# Patient Record
Sex: Male | Born: 2000 | Race: White | Hispanic: No | Marital: Single | Smoking: Never smoker
Health system: Southern US, Community
[De-identification: ages and names within clinical notes are randomized; demographics above are authoritative.]

---
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
## 2023-03-18 ENCOUNTER — Emergency Department (HOSPITAL_COMMUNITY)
Admission: EM | Admit: 2023-03-18 | Discharge: 2023-03-22 | Disposition: A | Discharge status: Home / Self Care | Payer: Worker's Compensation | Attending: Emergency Medicine | Admitting: Emergency Medicine

## 2023-03-18 ENCOUNTER — Encounter (HOSPITAL_COMMUNITY)
Admission: EM | Disposition: A | Discharge status: Home / Self Care | Payer: Self-pay | Source: Home / Self Care | Attending: Emergency Medicine

## 2023-03-18 ENCOUNTER — Emergency Department (HOSPITAL_COMMUNITY): Payer: Worker's Compensation | Admitting: Certified Registered Nurse Anesthetist

## 2023-03-18 ENCOUNTER — Other Ambulatory Visit: Payer: Self-pay

## 2023-03-18 ENCOUNTER — Emergency Department (HOSPITAL_COMMUNITY): Payer: Worker's Compensation

## 2023-03-18 ENCOUNTER — Emergency Department (EMERGENCY_DEPARTMENT_HOSPITAL): Payer: Worker's Compensation | Admitting: Certified Registered Nurse Anesthetist

## 2023-03-18 ENCOUNTER — Encounter (HOSPITAL_COMMUNITY): Payer: Self-pay

## 2023-03-18 DIAGNOSIS — Y990 Civilian activity done for income or pay: Secondary | ICD-10-CM | POA: Diagnosis not present

## 2023-03-18 DIAGNOSIS — S55002A Unspecified injury of ulnar artery at forearm level, left arm, initial encounter: Secondary | ICD-10-CM | POA: Diagnosis not present

## 2023-03-18 DIAGNOSIS — Z91010 Allergy to peanuts: Secondary | ICD-10-CM | POA: Insufficient documentation

## 2023-03-18 DIAGNOSIS — S59912A Unspecified injury of left forearm, initial encounter: Secondary | ICD-10-CM | POA: Diagnosis present

## 2023-03-18 DIAGNOSIS — S55012A Laceration of ulnar artery at forearm level, left arm, initial encounter: Secondary | ICD-10-CM | POA: Diagnosis not present

## 2023-03-18 DIAGNOSIS — Z23 Encounter for immunization: Secondary | ICD-10-CM | POA: Diagnosis not present

## 2023-03-18 DIAGNOSIS — S51812A Laceration without foreign body of left forearm, initial encounter: Secondary | ICD-10-CM | POA: Diagnosis not present

## 2023-03-18 DIAGNOSIS — W230XXA Caught, crushed, jammed, or pinched between moving objects, initial encounter: Secondary | ICD-10-CM | POA: Insufficient documentation

## 2023-03-18 HISTORY — PX: WOUND EXPLORATION: SHX6188

## 2023-03-18 HISTORY — PX: CYSTOSCOPY WITH HOLMIUM LASER LITHOTRIPSY: SHX6639

## 2023-03-18 LAB — COMPREHENSIVE METABOLIC PANEL
ALT: 13 U/L (ref 0–44)
AST: 18 U/L (ref 15–41)
Albumin: 4.1 g/dL (ref 3.5–5.0)
Alkaline Phosphatase: 29 U/L — ABNORMAL LOW (ref 38–126)
Anion gap: 16 — ABNORMAL HIGH (ref 5–15)
BUN: 14 mg/dL (ref 6–20)
CO2: 19 mmol/L — ABNORMAL LOW (ref 22–32)
Calcium: 9.1 mg/dL (ref 8.9–10.3)
Chloride: 104 mmol/L (ref 98–111)
Creatinine, Ser: 1.17 mg/dL (ref 0.61–1.24)
GFR, Estimated: >60 mL/min (ref >60)
Glucose, Bld: 233 mg/dL — ABNORMAL HIGH (ref 70–99)
Potassium: 3.1 mmol/L — ABNORMAL LOW (ref 3.5–5.1)
Sodium: 139 mmol/L (ref 135–145)
Total Bilirubin: 0.8 mg/dL (ref 0.3–1.2)
Total Protein: 6.4 g/dL — ABNORMAL LOW (ref 6.5–8.1)

## 2023-03-18 LAB — I-STAT CHEM 8, ED
BUN: 14 mg/dL (ref 6–20)
BUN: 12 mg/dL (ref 6–20)
Calcium, Ion: 0.94 mmol/L — ABNORMAL LOW (ref 1.15–1.40)
Calcium, Ion: 1.18 mmol/L (ref 1.15–1.40)
Chloride: 106 mmol/L (ref 98–111)
Chloride: 104 mmol/L (ref 98–111)
Creatinine, Ser: 1 mg/dL (ref 0.61–1.24)
Creatinine, Ser: 0.8 mg/dL (ref 0.61–1.24)
Glucose, Bld: 231 mg/dL — ABNORMAL HIGH (ref 70–99)
Glucose, Bld: 145 mg/dL — ABNORMAL HIGH (ref 70–99)
HCT: 40 % (ref 39.0–52.0)
HCT: 31 % — ABNORMAL LOW (ref 39.0–52.0)
Hemoglobin: 13.6 g/dL (ref 13.0–17.0)
Hemoglobin: 10.5 g/dL — ABNORMAL LOW (ref 13.0–17.0)
Potassium: 3.1 mmol/L — ABNORMAL LOW (ref 3.5–5.1)
Potassium: 3.5 mmol/L (ref 3.5–5.1)
Sodium: 139 mmol/L (ref 135–145)
Sodium: 138 mmol/L (ref 135–145)
TCO2: 18 mmol/L — ABNORMAL LOW (ref 22–32)
TCO2: 25 mmol/L (ref 22–32)

## 2023-03-18 LAB — CBC
HCT: 39.4 % (ref 39.0–52.0)
Hemoglobin: 14.4 g/dL (ref 13.0–17.0)
MCH: 34.6 pg — ABNORMAL HIGH (ref 26.0–34.0)
MCHC: 36.5 g/dL — ABNORMAL HIGH (ref 30.0–36.0)
MCV: 94.7 fL (ref 80.0–100.0)
Platelets: 137 10*3/uL — ABNORMAL LOW (ref 150–400)
RBC: 4.16 MIL/uL — ABNORMAL LOW (ref 4.22–5.81)
RDW: 11.6 % (ref 11.5–15.5)
WBC: 11.4 10*3/uL — ABNORMAL HIGH (ref 4.0–10.5)
nRBC: 0 % (ref 0.0–0.2)

## 2023-03-18 LAB — SAMPLE TO BLOOD BANK

## 2023-03-18 LAB — PROTIME-INR
INR: 1.1 (ref 0.8–1.2)
Prothrombin Time: 14.7 seconds (ref 11.4–15.2)

## 2023-03-18 LAB — ABO/RH: ABO/RH(D): O POS

## 2023-03-18 LAB — TYPE AND SCREEN
ABO/RH(D): O POS
Antibody Screen: NEGATIVE

## 2023-03-18 LAB — LACTIC ACID, PLASMA: Lactic Acid, Venous: 2.3 mmol/L (ref 0.5–1.9)

## 2023-03-18 LAB — ETHANOL: Alcohol, Ethyl (B): <10 mg/dL (ref <10)

## 2023-03-18 SURGERY — WOUND EXPLORATION
Anesthesia: General | Site: Arm Lower | Laterality: Left

## 2023-03-18 MED ORDER — PROPOFOL 10 MG/ML IV BOLUS
INTRAVENOUS | Status: DC | PRN
  Administered 2023-03-18: 200 mg via INTRAVENOUS

## 2023-03-18 MED ORDER — MIDAZOLAM HCL 2 MG/2ML IJ SOLN
INTRAMUSCULAR | Status: DC | PRN
  Administered 2023-03-18 (×2): 2 mg via INTRAVENOUS

## 2023-03-18 MED ORDER — LIDOCAINE 2% (20 MG/ML) 5 ML SYRINGE
INTRAMUSCULAR | Status: AC
  Filled 2023-03-18: qty 5

## 2023-03-18 MED ORDER — LIDOCAINE 2% (20 MG/ML) 5 ML SYRINGE
INTRAMUSCULAR | Status: DC | PRN
  Administered 2023-03-18: 60 mg via INTRAVENOUS

## 2023-03-18 MED ORDER — KETAMINE HCL 50 MG/5ML IJ SOSY
PREFILLED_SYRINGE | INTRAMUSCULAR | Status: AC
  Filled 2023-03-18: qty 5

## 2023-03-18 MED ORDER — LACTATED RINGERS IV BOLUS
1000.0000 mL | Freq: Once | INTRAVENOUS | Status: AC
  Administered 2023-03-18: 1000 mL via INTRAVENOUS

## 2023-03-18 MED ORDER — CEFAZOLIN SODIUM-DEXTROSE 2-4 GM/100ML-% IV SOLN
2.0000 g | Freq: Once | INTRAVENOUS | Status: AC
  Administered 2023-03-18: 2 g via INTRAVENOUS

## 2023-03-18 MED ORDER — SODIUM CHLORIDE 0.9 % IR SOLN
Status: DC | PRN
  Administered 2023-03-18: 3000 mL

## 2023-03-18 MED ORDER — SUCCINYLCHOLINE CHLORIDE 200 MG/10ML IV SOSY
PREFILLED_SYRINGE | INTRAVENOUS | Status: DC | PRN
  Administered 2023-03-18: 120 mg via INTRAVENOUS

## 2023-03-18 MED ORDER — PROPOFOL 10 MG/ML IV BOLUS
INTRAVENOUS | Status: AC
  Filled 2023-03-18: qty 20

## 2023-03-18 MED ORDER — IOHEXOL 350 MG/ML SOLN
100.0000 mL | Freq: Once | INTRAVENOUS | Status: AC | PRN
  Administered 2023-03-18: 100 mL via INTRAVENOUS

## 2023-03-18 MED ORDER — HYDROMORPHONE HCL 1 MG/ML IJ SOLN
1.0000 mg | Freq: Once | INTRAMUSCULAR | Status: AC
  Administered 2023-03-18: 1 mg via INTRAVENOUS
  Filled 2023-03-18: qty 1

## 2023-03-18 MED ORDER — FENTANYL CITRATE (PF) 250 MCG/5ML IJ SOLN
INTRAMUSCULAR | Status: DC | PRN
  Administered 2023-03-18 (×3): 50 ug via INTRAVENOUS

## 2023-03-18 MED ORDER — CEFAZOLIN SODIUM-DEXTROSE 2-4 GM/100ML-% IV SOLN
INTRAVENOUS | Status: AC
  Filled 2023-03-18: qty 100

## 2023-03-18 MED ORDER — IBUPROFEN 200 MG PO TABS: 600.0000 mg | ORAL_TABLET | Freq: Four times a day (QID) | ORAL | Status: AC

## 2023-03-18 MED ORDER — LACTATED RINGERS IV SOLN: INTRAVENOUS | Status: DC | PRN

## 2023-03-18 MED ORDER — PHENYLEPHRINE HCL-NACL 20-0.9 MG/250ML-% IV SOLN
INTRAVENOUS | Status: DC | PRN
  Administered 2023-03-18: 40 ug/min via INTRAVENOUS

## 2023-03-18 MED ORDER — PROMETHAZINE HCL 25 MG/ML IJ SOLN: 6.2500 mg | INTRAMUSCULAR | Status: DC | PRN

## 2023-03-18 MED ORDER — FENTANYL CITRATE (PF) 100 MCG/2ML IJ SOLN
INTRAMUSCULAR | Status: AC
  Filled 2023-03-18: qty 2

## 2023-03-18 MED ORDER — OXYCODONE HCL 5 MG PO TABS: 5.0000 mg | ORAL_TABLET | Freq: Four times a day (QID) | ORAL | 0 refills | Status: AC | PRN

## 2023-03-18 MED ORDER — MIDAZOLAM HCL 2 MG/2ML IJ SOLN
INTRAMUSCULAR | Status: AC
  Filled 2023-03-18: qty 2

## 2023-03-18 MED ORDER — ONDANSETRON HCL 4 MG/2ML IJ SOLN
4.0000 mg | Freq: Once | INTRAMUSCULAR | Status: AC
  Administered 2023-03-18: 4 mg via INTRAVENOUS
  Filled 2023-03-18: qty 2

## 2023-03-18 MED ORDER — CEFAZOLIN SODIUM-DEXTROSE 2-4 GM/100ML-% IV SOLN
2.0000 g | Freq: Once | INTRAVENOUS | Status: DC
  Filled 2023-03-18: qty 100

## 2023-03-18 MED ORDER — PHENYLEPHRINE 80 MCG/ML (10ML) SYRINGE FOR IV PUSH (FOR BLOOD PRESSURE SUPPORT)
PREFILLED_SYRINGE | INTRAVENOUS | Status: DC | PRN
  Administered 2023-03-18: 160 ug via INTRAVENOUS
  Administered 2023-03-18 (×2): 320 ug via INTRAVENOUS

## 2023-03-18 MED ORDER — TETANUS-DIPHTH-ACELL PERTUSSIS 5-2.5-18.5 LF-MCG/0.5 IM SUSY
0.5000 mL | PREFILLED_SYRINGE | Freq: Once | INTRAMUSCULAR | Status: AC
  Administered 2023-03-18: 0.5 mL via INTRAMUSCULAR
  Filled 2023-03-18: qty 0.5

## 2023-03-18 MED ORDER — FENTANYL CITRATE (PF) 250 MCG/5ML IJ SOLN
INTRAMUSCULAR | Status: AC
  Filled 2023-03-18: qty 5

## 2023-03-18 MED ORDER — ACETAMINOPHEN 10 MG/ML IV SOLN
INTRAVENOUS | Status: DC | PRN
  Administered 2023-03-18: 1000 mg via INTRAVENOUS

## 2023-03-18 MED ORDER — OXYCODONE HCL 5 MG PO TABS: 5.0000 mg | ORAL_TABLET | Freq: Once | ORAL | Status: DC | PRN

## 2023-03-18 MED ORDER — DEXAMETHASONE SODIUM PHOSPHATE 10 MG/ML IJ SOLN
INTRAMUSCULAR | Status: AC
  Filled 2023-03-18: qty 1

## 2023-03-18 MED ORDER — FENTANYL CITRATE (PF) 100 MCG/2ML IJ SOLN
25.0000 ug | INTRAMUSCULAR | Status: DC | PRN
  Administered 2023-03-18 (×4): 25 ug via INTRAVENOUS

## 2023-03-18 MED ORDER — PHENYLEPHRINE 80 MCG/ML (10ML) SYRINGE FOR IV PUSH (FOR BLOOD PRESSURE SUPPORT)
PREFILLED_SYRINGE | INTRAVENOUS | Status: AC
  Filled 2023-03-18: qty 10

## 2023-03-18 MED ORDER — ROCURONIUM BROMIDE 10 MG/ML (PF) SYRINGE
PREFILLED_SYRINGE | INTRAVENOUS | Status: DC | PRN
  Administered 2023-03-18: 50 mg via INTRAVENOUS
  Administered 2023-03-18: 10 mg via INTRAVENOUS

## 2023-03-18 MED ORDER — ALBUMIN HUMAN 5 % IV SOLN: INTRAVENOUS | Status: DC | PRN

## 2023-03-18 MED ORDER — DEXMEDETOMIDINE HCL IN NACL 80 MCG/20ML IV SOLN
INTRAVENOUS | Status: DC | PRN
  Administered 2023-03-18: 4 ug via INTRAVENOUS
  Administered 2023-03-18: 8 ug via INTRAVENOUS
  Administered 2023-03-18: 4 ug via INTRAVENOUS

## 2023-03-18 MED ORDER — SUGAMMADEX SODIUM 200 MG/2ML IV SOLN
INTRAVENOUS | Status: DC | PRN
  Administered 2023-03-18: 200 mg via INTRAVENOUS

## 2023-03-18 MED ORDER — ACETAMINOPHEN 10 MG/ML IV SOLN
INTRAVENOUS | Status: AC
  Filled 2023-03-18: qty 100

## 2023-03-18 MED ORDER — ONDANSETRON HCL 4 MG/2ML IJ SOLN
INTRAMUSCULAR | Status: AC
  Administered 2023-03-18: 4 mg
  Filled 2023-03-18: qty 2

## 2023-03-18 MED ORDER — CEPHALEXIN 500 MG PO CAPS: 500.0000 mg | ORAL_CAPSULE | Freq: Four times a day (QID) | ORAL | 0 refills | Status: AC

## 2023-03-18 MED ORDER — ROCURONIUM BROMIDE 10 MG/ML (PF) SYRINGE
PREFILLED_SYRINGE | INTRAVENOUS | Status: AC
  Filled 2023-03-18: qty 10

## 2023-03-18 MED ORDER — 0.9 % SODIUM CHLORIDE (POUR BTL) OPTIME
TOPICAL | Status: DC | PRN
  Administered 2023-03-18: 1000 mL

## 2023-03-18 MED ORDER — OXYCODONE HCL 5 MG/5ML PO SOLN: 5.0000 mg | Freq: Once | ORAL | Status: DC | PRN

## 2023-03-18 MED ORDER — POTASSIUM CHLORIDE 10 MEQ/100ML IV SOLN
10.0000 meq | INTRAVENOUS | Status: AC
  Administered 2023-03-18: 10 meq via INTRAVENOUS
  Filled 2023-03-18 (×4): qty 100

## 2023-03-18 MED ORDER — ACETAMINOPHEN 325 MG PO TABS: 650.0000 mg | ORAL_TABLET | Freq: Four times a day (QID) | ORAL | Status: AC

## 2023-03-18 MED ORDER — KETAMINE HCL 10 MG/ML IJ SOLN
INTRAMUSCULAR | Status: DC | PRN
  Administered 2023-03-18: 25 mg via INTRAVENOUS

## 2023-03-18 MED ORDER — DEXAMETHASONE SODIUM PHOSPHATE 10 MG/ML IJ SOLN
INTRAMUSCULAR | Status: DC | PRN
  Administered 2023-03-18: 5 mg via INTRAVENOUS

## 2023-03-18 MED ORDER — ONDANSETRON HCL 4 MG/2ML IJ SOLN
INTRAMUSCULAR | Status: AC
  Filled 2023-03-18: qty 2

## 2023-03-18 MED ORDER — DEXMEDETOMIDINE HCL IN NACL 80 MCG/20ML IV SOLN
INTRAVENOUS | Status: AC
  Filled 2023-03-18: qty 20

## 2023-03-18 SURGICAL SUPPLY — 56 items
APL PRP STRL LF DISP 70% ISPRP (MISCELLANEOUS) ×1
BAG COUNTER SPONGE SURGICOUNT (BAG) ×1 IMPLANT
BAG SPNG CNTER NS LX DISP (BAG) ×1
BNDG CMPR 5X3 KNIT ELC UNQ LF (GAUZE/BANDAGES/DRESSINGS)
BNDG CMPR 5X4 CHSV STRCH STRL (GAUZE/BANDAGES/DRESSINGS) ×1
BNDG CMPR 9X4 STRL LF SNTH (GAUZE/BANDAGES/DRESSINGS)
BNDG COHESIVE 4X5 TAN STRL (GAUZE/BANDAGES/DRESSINGS) IMPLANT
BNDG COHESIVE 4X5 TAN STRL LF (GAUZE/BANDAGES/DRESSINGS) IMPLANT
BNDG ELASTIC 3INX 5YD STR LF (GAUZE/BANDAGES/DRESSINGS) IMPLANT
BNDG ELASTIC 4X5.8 VLCR STR LF (GAUZE/BANDAGES/DRESSINGS) ×1 IMPLANT
BNDG ESMARK 4X9 LF (GAUZE/BANDAGES/DRESSINGS) IMPLANT
BNDG GAUZE DERMACEA FLUFF 4 (GAUZE/BANDAGES/DRESSINGS) ×1 IMPLANT
BNDG GZE DERMACEA 4 6PLY (GAUZE/BANDAGES/DRESSINGS) ×1
CHLORAPREP W/TINT 26 (MISCELLANEOUS) ×1 IMPLANT
COVER SURGICAL LIGHT HANDLE (MISCELLANEOUS) ×1 IMPLANT
CUFF TOURN SGL QUICK 18X4 (TOURNIQUET CUFF) ×1 IMPLANT
CUFF TOURN SGL QUICK 24 (TOURNIQUET CUFF) ×1
CUFF TRNQT CYL 24X4X16.5-23 (TOURNIQUET CUFF) IMPLANT
DRAPE SURG 17X23 STRL (DRAPES) ×1 IMPLANT
DRSG ADAPTIC 3X8 NADH LF (GAUZE/BANDAGES/DRESSINGS) ×1 IMPLANT
ELECT REM PT RETURN 9FT ADLT (ELECTROSURGICAL)
ELECTRODE REM PT RTRN 9FT ADLT (ELECTROSURGICAL) IMPLANT
GAUZE SPONGE 4X4 12PLY STRL (GAUZE/BANDAGES/DRESSINGS) ×1 IMPLANT
GLOVE BIO SURGEON STRL SZ7.5 (GLOVE) ×1 IMPLANT
GLOVE BIOGEL PI IND STRL 8 (GLOVE) ×1 IMPLANT
GOWN STRL REUS W/ TWL LRG LVL3 (GOWN DISPOSABLE) ×3 IMPLANT
GOWN STRL REUS W/TWL 2XL LVL3 (GOWN DISPOSABLE) ×1 IMPLANT
GOWN STRL REUS W/TWL LRG LVL3 (GOWN DISPOSABLE) ×3
KIT BASIN OR (CUSTOM PROCEDURE TRAY) ×1 IMPLANT
KIT TURNOVER KIT B (KITS) ×1 IMPLANT
MANIFOLD NEPTUNE II (INSTRUMENTS) ×1 IMPLANT
NS IRRIG 1000ML POUR BTL (IV SOLUTION) ×1 IMPLANT
PACK ORTHO EXTREMITY (CUSTOM PROCEDURE TRAY) ×1 IMPLANT
PAD ARMBOARD 7.5X6 YLW CONV (MISCELLANEOUS) ×2 IMPLANT
PAD CAST 4YDX4 CTTN HI CHSV (CAST SUPPLIES) IMPLANT
PADDING CAST COTTON 4X4 STRL (CAST SUPPLIES) ×1
SET CYSTO W/LG BORE CLAMP LF (SET/KITS/TRAYS/PACK) IMPLANT
SLING ARM FOAM STRAP LRG (SOFTGOODS) IMPLANT
SPLINT PLASTER CAST FAST 4X15 (CAST SUPPLIES) IMPLANT
SPONGE T-LAP 18X18 ~~LOC~~+RFID (SPONGE) IMPLANT
STOCKINETTE IMPERVIOUS 9X36 MD (GAUZE/BANDAGES/DRESSINGS) IMPLANT
SUT ETHILON 3 0 PS 1 (SUTURE) IMPLANT
SUT ETHILON 4 0 PS 2 18 (SUTURE) IMPLANT
SUT ETHILON 8 0 BV130 4 (SUTURE) IMPLANT
SUT PROLENE 6 0 BV (SUTURE) IMPLANT
SUT PROLENE 6 0 C 1 24 (SUTURE) IMPLANT
SUT VIC AB 2-0 CT1 27 (SUTURE) ×1
SUT VIC AB 2-0 CT1 TAPERPNT 27 (SUTURE) IMPLANT
SUT VIC AB 2-0 CT2 27 (SUTURE) IMPLANT
SUT VICRYL RAPIDE 4/0 PS 2 (SUTURE) IMPLANT
TOWEL GREEN STERILE (TOWEL DISPOSABLE) ×1 IMPLANT
TOWEL GREEN STERILE FF (TOWEL DISPOSABLE) ×1 IMPLANT
TUBE CONNECTING 12X1/4 (SUCTIONS) ×1 IMPLANT
UNDERPAD 30X36 HEAVY ABSORB (UNDERPADS AND DIAPERS) ×1 IMPLANT
WATER STERILE IRR 1000ML POUR (IV SOLUTION) ×1 IMPLANT
YANKAUER SUCT BULB TIP NO VENT (SUCTIONS) ×1 IMPLANT

## 2023-03-18 NOTE — Consult Note (Signed)
[facility] [place]  ASSESSMENT / PLAN: 22 y.o. male with complex laceration to the left forearm with arterial bleeding per EMS/ER report. Tourniquet in place with good hemostasis. Plan exploration in OR with Dr. Janee Morn.  CHIEF COMPLAINT: factory injury  HISTORY OF PRESENT ILLNESS: George Chapman is a 22 y.o. 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 This caused a complex laceration of the left anterior and medial forearm. Per report, arterial bleeding was seen. A tourniquet was applied at the scene. This was taken down by ER staff, but significant bleeding was encountered and so it was reapplied.   History reviewed. No pertinent past medical history.  History reviewed. No pertinent surgical history.  History reviewed. No pertinent family history.  Social History   Socioeconomic History   Marital status: Single    Spouse name: Not on file   Number of children: Not on file   Years of education: Not on file   Highest education level: Not on file  Occupational History   Not on file  Tobacco Use   Smoking status: Never   Smokeless tobacco: Never  Substance and Sexual Activity   Alcohol use: No   Drug use: No   Sexual activity: Not on file  Other Topics Concern   Not on file  Social History Narrative   Not on file   Social Determinants of Health   Financial Resource Strain: Not on file  Food Insecurity: Low Risk  (01/28/2023)   Received from [facility]   Food vital sign    Within the past 12 months, you worried that your food would run out before you got money to buy more: Never true    Within the past 12 months, the food you bought just didn't last and you didn't have money to get more. : Never true  Transportation Needs: Not on file (01/28/2023)  Physical Activity: Not on file  Stress: Not on file  Social Connections: Not on file  Intimate Partner Violence: Not on file    Allergies  Allergen  Reactions   Peanut-Containing Drug Products     Current Facility-Administered Medications  Medication Dose Route Frequency Provider Last Rate Last Admin   acetaminophen (OFIRMEV) 10 MG/ML IV            ceFAZolin (ANCEF) 2-4 GM/100ML-% IVPB            ceFAZolin (ANCEF) IVPB 2g/100 mL premix  2 g Intravenous Once Mesner, Barbara Cower, MD       potassium chloride 10 mEq in 100 mL IVPB  10 mEq Intravenous Q1 Hr x 4 Mesner, Barbara Cower, MD       Current Outpatient Medications  Medication Sig Dispense Refill   cephALEXin (KEFLEX) 500 MG capsule Take 1 capsule (500 mg total) by mouth 4 (four) times daily. 20 capsule 0   ibuprofen (ADVIL,MOTRIN) 800 MG tablet Take 1 tablet (800 mg total) by mouth every 8 (eight) hours as needed. 15 tablet 0   ISOtretinoin (ACCUTANE) 40 MG capsule Take 40 mg by mouth 2 (two) times daily.      PHYSICAL EXAM Vitals:   03/18/23 0438 03/18/23 0500 03/18/23 0515 03/18/23 0530  BP:  107/79 (!) 124/92 (!) 127/101  Pulse: 84 77 92 88  Resp: 11 12 17 13   Temp: 98 F (36.7 C)     TempSrc: Oral     SpO2: 100% 100% 100% 100%  Weight:      Height:  Young man in no distress Regular rate and rhythm Unlabored breathing Left arm with tourniquet in place. Saturated bandage in place   PERTINENT LABORATORY AND RADIOLOGIC DATA  Most recent CBC    Latest Ref Rng & Units 03/18/2023    4:21 AM 03/18/2023    4:19 AM  CBC  WBC 4.0 - 10.5 K/uL  11.4   Hemoglobin 13.0 - 17.0 g/dL 36.6  44.0   Hematocrit 39.0 - 52.0 % 40.0  39.4   Platelets 150 - 400 K/uL  137      Most recent CMP    Latest Ref Rng & Units 03/18/2023    4:21 AM 03/18/2023    4:19 AM  CMP  Glucose 70 - 99 mg/dL 347  425   BUN 6 - 20 mg/dL 14  14   Creatinine 9.56 - 1.24 mg/dL 3.87  5.64   Sodium 332 - 145 mmol/L 139  139   Potassium 3.5 - 5.1 mmol/L 3.1  3.1   Chloride 98 - 111 mmol/L 106  104   CO2 22 - 32 mmol/L  19   Calcium 8.9 - 10.3 mg/dL  9.1   Total Protein 6.5 - 8.1 g/dL  6.4   Total  Bilirubin 0.3 - 1.2 mg/dL  0.8   Alkaline Phos 38 - 126 U/L  29   AST 15 - 41 U/L  18   ALT 0 - 44 U/L  13     Tiwatope Emmitt N. Lenell Antu, MD FACS [facility] Phone Number: [phone] 03/18/2023 5:53 AM   Total time spent on preparing this encounter including chart review, data review, collecting history, examining the patient, coordinating care for this new patient, 60 minutes.  Portions of this report may have been transcribed using voice recognition software.  Every effort has been made to ensure accuracy; however, inadvertent computerized transcription errors may still be present.

## 2023-03-18 NOTE — ED Notes (Signed)
Tourniquet to the left upper arm removed at 0427 by MD Mesner to assess patients injuries.

## 2023-03-18 NOTE — Anesthesia Preprocedure Evaluation (Signed)
Anesthesia Evaluation  Patient identified by MRN, date of birth, ID band Patient awake    Reviewed: Allergy & Precautions, NPO status , Patient's Chart, lab work & pertinent test results  History of Anesthesia Complications Negative for: history of anesthetic complications  Airway Mallampati: I  TM Distance: >3 FB Neck ROM: Full    Dental  (+) Dental Advisory Given, Teeth Intact   Pulmonary neg pulmonary ROS   Pulmonary exam normal        Cardiovascular negative cardio ROS Normal cardiovascular exam     Neuro/Psych negative neurological ROS  negative psych ROS   GI/Hepatic negative GI ROS, Neg liver ROS,,,  Endo/Other   K 3.1   Renal/GU negative Renal ROS     Musculoskeletal negative musculoskeletal ROS (+)    Abdominal   Peds  Hematology  Plt 137k    Anesthesia Other Findings   Reproductive/Obstetrics                             Anesthesia Physical Anesthesia Plan  ASA: 1 and emergent  Anesthesia Plan: General   Post-op Pain Management: Ofirmev IV (intra-op)* and Ketamine IV*   Induction: Intravenous  PONV Risk Score and Plan: 2 and Treatment may vary due to age or medical condition, Ondansetron, Dexamethasone and Midazolam  Airway Management Planned: Oral ETT  Additional Equipment: None  Intra-op Plan:   Post-operative Plan: Extubation in OR  Informed Consent: I have reviewed the patients History and Physical, chart, labs and discussed the procedure including the risks, benefits and alternatives for the proposed anesthesia with the patient or authorized representative who has indicated his/her understanding and acceptance.     Dental advisory given  Plan Discussed with: CRNA and Anesthesiologist  Anesthesia Plan Comments:        Anesthesia Quick Evaluation

## 2023-03-18 NOTE — Op Note (Signed)
03/18/2023  7:43 AM  PATIENT:  George Chapman  22 y.o. male  PRE-OPERATIVE DIAGNOSIS:  Complex left forearm wound with suspected arterial injury  POST-OPERATIVE DIAGNOSIS:  Same--confirmed ulnar artery injury  PROCEDURE:   1. Exploration of left forearm complex wound    2. Primary repair of left ulnar nerve    3. Repair of forearm muscles x 2 (FDS/FCU)    4. Complex repair of open wound of left forearm, 25cm    5. Excisional debridement of skin/SQ/muscle of open wound, 10 x 10 cm  SURGEON: Leeasia Secrist A. Janee Morn, MD  PHYSICIAN ASSISTANT: none  ANESTHESIA:  general  SPECIMENS:  None  DRAINS:   None  EBL:  100 mL  PREOPERATIVE INDICATIONS:  George Chapman is a  22 y.o. male with a complex left forearm wound from an industrial machine accident  The risks benefits and alternatives were discussed with the patient preoperatively including but not limited to the risks of infection, bleeding, nerve injury, cardiopulmonary complications, the need for revision surgery, among others, and the patient verbalized understanding and consented to proceed.  OPERATIVE IMPLANTS: none  OPERATIVE PROCEDURE:  After receiving prophylactic antibiotics, this patient was identified in the holding area and escorted to the operative theater.  He had a mechanical tourniquet in place in the upper arm.  General anesthesia was administered.  I remove the mechanical tourniquet replaced it with a pneumatic tourniquet.  For the time being, it was not inflated.  The pressure dressing was removed.  Dr. Juanetta Gosling from vascular surgery evaluated the limb, by palpation and with the Doppler.  The purple coloration of the limb changed to pink.  The limb was prepped with Betadine scrub followed by Betadine paint, and midway through heavy bleeding again ensued.  The pneumatic tourniquet was inflated.  The limb was draped in standard fashion. Initially, I assisted Dr. Lenell Antu with exposure and retraction.  The traumatic wound was  extended slightly distally to allow for better exposure, and the ulnar artery was found to have been transected.  The ulnar nerve was identified and found to be mostly transected with just one fascicular bundle intact.  Temporary clamps were placed on the ulnar artery on both stumps, and the tourniquet released.  There was no excessive hemorrhage.  Using a sterile Doppler, a good radial signal was established, palmar arch, and a week or signal in the distal portion of the ulnar artery.  He determine that the most appropriate course of action was to ligate both ends of the artery and he did this. It was then that I took over primary direction of the procedure.  I further explored the wound.  I copiously irrigated and debrided some of the jagged irregular skin edges, some small elements of nonviable muscle.  It appeared that the muscle injury was mostly to the FDS and the FCU, mainly within the muscle bellies.  The FDS had some beginnings of tendon which was incorporated into the repair..  These were reapproximated and coapted with 2-0 Vicryl interrupted suture.  The ulnar nerve was repaired with 8-0 nylon using loupe assisted magnification.  The ends were nicely coapted.  Some additional volar fasciotomy was performed with a sliding scissor technique.   The complex traumatic skin wound was then repaired using 2-0 Vicryl deep dermal buried subcuticular sutures and externally 3-0 nylon.  A below elbow splint dressing was applied with dorsal plaster that place the wrist in neutral and did not allow for extension at the wrist for the sake of  protecting both the tendon and nerve repair.  He was awakened and taken to the recovery room in stable condition, breathing spontaneously.  DISPOSITION: He will be discharged home with typical instructions, on oral antibiotics, with reevaluation as an outpatient likely next week.

## 2023-03-18 NOTE — ED Provider Notes (Addendum)
[place] EMERGENCY DEPARTMENT AT [facility] Provider Note   CSN: [id number] Arrival date & time: 03/18/23  0407     History  Chief Complaint  Patient presents with   Trauma    George Chapman is a 22 y.o. male.  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  They were able to stop the machine and stop it from going further up.  No injuries elsewhere.  Unknown tetanus.  No allergies to any medications that he knows of.  Sees some pain meds and route.  Tachycardic and route but vital signs otherwise reassuring.    Home Medications Prior to Admission medications   Medication Sig Start Date End Date Taking? Authorizing Provider  cephALEXin (KEFLEX) 500 MG capsule Take 1 capsule (500 mg total) by mouth 4 (four) times daily. 08/15/17   Renne Crigler, PA-C  ibuprofen (ADVIL,MOTRIN) 800 MG tablet Take 1 tablet (800 mg total) by mouth every 8 (eight) hours as needed. 01/31/17   Trixie Dredge, PA-C  ISOtretinoin (ACCUTANE) 40 MG capsule Take 40 mg by mouth 2 (two) times daily.    [provider]      Allergies    Peanut-containing drug products    Review of Systems   Review of Systems  Physical Exam Updated Vital Signs BP (!) 127/101   Pulse 88   Temp 98 F (36.7 C) (Oral)   Resp 13   Ht 5\' 9"  (1.753 m)   Wt 65.8 kg   SpO2 100%   BMI 21.41 kg/m  Physical Exam Vitals and nursing note reviewed.  Constitutional:      Appearance: He is well-developed.  HENT:     Head: Normocephalic and atraumatic.     Mouth/Throat:     Mouth: Mucous membranes are moist.     Pharynx: Oropharynx is clear.  Eyes:     Pupils: Pupils are equal, round, and reactive to light.  Cardiovascular:     Rate and Rhythm: Normal rate.  Pulmonary:     Effort: Pulmonary effort is normal. No respiratory distress.  Abdominal:     General: There is no distension.   Musculoskeletal:        General: Normal range of motion.     Cervical back: Normal range of motion.  Skin:    General: Skin is warm and dry.     Comments: Large deep laceration to left forearm with muscle belly exposed.  Tourniquet in place, no pulses or neurologic function distal to the same.  Tourniquet time 0328.  Neurological:     Mental Status: He is alert.     ED Results / Procedures / Treatments   Labs (all labs ordered are listed, but only abnormal results are displayed) Labs Reviewed  COMPREHENSIVE METABOLIC PANEL - Abnormal; Notable for the following components:      Result Value   Potassium 3.1 (*)    CO2 19 (*)    Glucose, Bld 233 (*)    Total Protein 6.4 (*)    Alkaline Phosphatase 29 (*)    Anion gap 16 (*)    All other components within normal limits  CBC - Abnormal; Notable for the following components:   WBC 11.4 (*)    RBC 4.16 (*)    MCH 34.6 (*)    MCHC 36.5 (*)    Platelets 137 (*)    All  other components within normal limits  LACTIC ACID, PLASMA - Abnormal; Notable for the following components:   Lactic Acid, Venous 2.3 (*)    All other components within normal limits  I-STAT CHEM 8, ED - Abnormal; Notable for the following components:   Potassium 3.1 (*)    Glucose, Bld 231 (*)    Calcium, Ion 0.94 (*)    TCO2 18 (*)    All other components within normal limits  ETHANOL  PROTIME-INR  URINALYSIS, ROUTINE W REFLEX MICROSCOPIC  I-STAT CHEM 8, ED  SAMPLE TO BLOOD BANK  TYPE AND SCREEN  ABO/RH    EKG None  Radiology CT ANGIO UP EXTREM LEFT W &/OR WO CONTAST  Result Date: 03/18/2023 CLINICAL DATA:  Crush injury left forearm. Left arm got stuck in a roller at work. EXAM: CT ANGIOGRAPHY OF THE LEFT UPPEREXTREMITY TECHNIQUE: Multidetector CT imaging of the left upperwas performed using the standard protocol during bolus administration of intravenous contrast. Multiplanar CT image reconstructions and MIPs were obtained to evaluate the vascular  anatomy. RADIATION DOSE REDUCTION: This exam was performed according to the departmental dose-optimization program which includes automated exposure control, adjustment of the mA and/or kV according to patient size and/or use of iterative reconstruction technique. CONTRAST:  OMNIPAQUE IOHEXOL 350 MG/ML SOLN COMPARISON:  Left forearm, left hand x-rays earlier today. FINDINGS: Technical note: Image quality is suboptimal due to metallic artifact from overlying wiring and due to the scan having been performed with the patient's arm by his side causing beam hardening. Image quality is particularly limited in the forearm, wrist, and hand. Inflow: Normal appearance of the visualized thoracic aorta, including in the distal arch and descending segment. The left common carotid artery, left subclavian artery and proximal left vertebral artery are normal. The axillary artery is widely patent. Upper arm: The axial and brachial artery are widely patent. There is a loosely applied tourniquet over the mid upper arm Forearm, hand and wrist: The radial artery opacifies well and runs off into the wrist and at least into the proximal hand, without visible appreciable digital artery opacification. The ulnar and interosseous arteries both opacify well to about the mid forearm. Below this there is no detectable flow in these arteries. Whether this is due to suboptimal technique or an actual arterial injury is unclear. Musculoskeletal: No left upper extremity fracture, dislocation or degenerative change is seen and no primary pathologic process or joint effusions. The bone mineralization is normal. There is bulky bandaging overlying ventral/ventrolateral surface of the forearm. There appears to be a sizable defect in the ventral forearm skin and subcutaneous fat. The ventral musculature appears to protrude outward through the defect. There are few scattered air pockets in the deep soft tissue planes of the ventral compartment. If  there is a space-occupying hematoma in the deep soft tissues, it is isodense and unable to be further characterized. There is suspected to be an intramuscular contrast blush alongside the ulnar artery in the mid forearm, series 6 axial 245-49, worrisome for an active intramuscular bleed. This is at about the level where the artery stops being seen. There are no other visible vascular blushes. Review of the MIP images confirms the above findings. IMPRESSION: 1. Suboptimal image quality especially in the forearm, due to technique. 2. Normal arterial opacification performed. 3. Radial artery opacifies well in the wrist and proximal hand, but without visible appreciable digital artery opacification. The ulnar and interosseous arteries both opacify to about the mid forearm. Below this there is  no detectable flow in these arteries. Whether this is due to suboptimal technique or an actual arterial injury is unclear. 4. Suspected intramuscular contrast blush in the ventral mid forearm alongside the ulnar artery, at about the level where the artery stops being seen. This is worrisome for an active intramuscular bleed. 5. Sizable defect in the ventral forearm skin and subcutaneous fat with the ventral musculature appearing to protrude outward through the defect. 6. There are few scattered air pockets in the deep soft tissue planes of the ventral compartment. If there is a space-occupying hematoma in the deep soft tissues, it is isodense and unable to be further characterized. Close clinical follow-up for development of compartment syndrome is recommended. Electronically Signed   By: Almira Bar M.D.   On: 03/18/2023 05:38   DG Humerus Left  Result Date: 03/18/2023 CLINICAL DATA:  22 year old male status post work related blunt trauma to the left upper extremity. EXAM: LEFT HUMERUS - 1 VIEW COMPARISON:  Left forearm AP view today. FINDINGS: Single-view of the left humerus at 0407 hours. External artifact projects about  the arm and left chest wall. Bone mineralization is within normal limits. No left humerus fracture is identified. Grossly maintained alignment at the left shoulder and elbow. Visible left ribs appear intact and the left lung appears clear. IMPRESSION: No acute fracture or dislocation identified on single view of the left humerus. Electronically Signed   By: Odessa Fleming M.D.   On: 03/18/2023 04:48   DG Forearm Left  Result Date: 03/18/2023 CLINICAL DATA:  22 year old male status post work related blunt trauma to the left upper extremity. EXAM: LEFT FOREARM - 1 VIEW COMPARISON:  Single-view left hand today reported separately. FINDINGS: Single AP view of the forearm at 0407 hours. Radial aspect forearm soft tissue irregularity with dressing material in place. Underlying normal forearm bone mineralization. No fracture identified in the visible left radius or ulna. The elbow is incompletely included. IMPRESSION: Radial aspect left forearm soft tissue injury. No fracture or dislocation identified on single view of left forearm. Electronically Signed   By: Odessa Fleming M.D.   On: 03/18/2023 04:47   DG Hand 2 View Left  Result Date: 03/18/2023 CLINICAL DATA:  22 year old male status post work related blunt trauma to the left upper extremity. EXAM: LEFT HAND - 1 VIEW COMPARISON:  None Available. FINDINGS: AP view only of the left hand at 0408 hours. Bone mineralization is within normal limits. Distal radius and ulna appear intact. No carpal, metacarpal, or phalanx fracture or dislocation is identified. IMPRESSION: No acute fracture or dislocation identified on single view of the left hand. Electronically Signed   By: Odessa Fleming M.D.   On: 03/18/2023 04:45    Procedures .Critical Care  Performed by: Marily Memos, MD Authorized by: Marily Memos, MD   Critical care provider statement:    Critical care time (minutes):  30   Critical care was necessary to treat or prevent imminent or life-threatening deterioration of the  following conditions:  Trauma   Critical care was time spent personally by me on the following activities:  Development of treatment plan with patient or surrogate, discussions with consultants, evaluation of patient's response to treatment, examination of patient, ordering and review of laboratory studies, ordering and review of radiographic studies, ordering and performing treatments and interventions, pulse oximetry, re-evaluation of patient's condition and review of old charts     Medications Ordered in ED Medications  ceFAZolin (ANCEF) IVPB 2g/100 mL premix ( Intravenous MAR  Hold 03/18/23 0613)  potassium chloride 10 mEq in 100 mL IVPB ( Intravenous Automatically Held 03/18/23 0830)  acetaminophen (OFIRMEV) 10 MG/ML IV (has no administration in time range)  ceFAZolin (ANCEF) 2-4 GM/100ML-% IVPB (has no administration in time range)  sodium chloride irrigation 0.9 % (3,000 mLs Irrigation Given 03/18/23 0618)  0.9 % irrigation (POUR BTL) (1,000 mLs Irrigation Given 03/18/23 0619)  Tdap (BOOSTRIX) injection 0.5 mL (0.5 mLs Intramuscular Given 03/18/23 0431)  ceFAZolin (ANCEF) IVPB 2g/100 mL premix (0 g Intravenous Stopped 03/18/23 0526)  HYDROmorphone (DILAUDID) injection 1 mg (1 mg Intravenous Given 03/18/23 0421)  lactated ringers bolus 1,000 mL (1,000 mLs Intravenous New Bag/Given 03/18/23 0435)  ondansetron (ZOFRAN) 4 MG/2ML injection (4 mg  Given 03/18/23 0421)  iohexol (OMNIPAQUE) 350 MG/ML injection 100 mL (100 mLs Intravenous Contrast Given 03/18/23 0456)  ondansetron (ZOFRAN) injection 4 mg (4 mg Intravenous Given 03/18/23 4098)    ED Course/ Medical Decision Making/ A&P                             Medical Decision Making Amount and/or Complexity of Data Reviewed Labs: ordered. Radiology: ordered. ECG/medicine tests: ordered.  Risk Prescription drug management.  Ancef, tdap, xrays, ct, consult hand.  Tourniquet down at 0424 after compression dressing placed. Within two minutes  patient had significant amount of blood loss. Radial pulse intact. Couldn't palpate ulnar pulse confidently. Hand pinked up really well all over with good cap refill. Continued to have significant bleeding. Tourniquet reapplied at 0428, will call hand for definitive treatment.  D/w Dr. Janee Morn, requested vascular consultation and likely OR for exploration/treatment.  D/w Dr. Lenell Antu who will meet in the OR.  Updated Dr. Janee Morn, will post for OR. Patient and family updated.   Final Clinical Impression(s) / ED Diagnoses Final diagnoses:  Laceration of left forearm, initial encounter    Rx / DC Orders ED Discharge Orders     None         Martavis Gurney, Barbara Cower, MD 03/18/23 1191    Marily Memos, MD 03/19/23 [phone]

## 2023-03-18 NOTE — Op Note (Signed)
DATE OF SERVICE: 03/18/2023  PATIENT:  George Chapman  22 y.o. male  PRE-OPERATIVE DIAGNOSIS:  complex laceration to left forearm  POST-OPERATIVE DIAGNOSIS:  Same, left ulnar artery injury  PROCEDURE:   Ligation of left ulnar artery injury  SURGEON:  Surgeons and Role:    Mack Hook, MD - Primary    * Lenell Antu Rande Brunt, MD  ANESTHESIA:   general  EBL: minimal from first portion of procedure  BLOOD ADMINISTERED:none  DRAINS: none   LOCAL MEDICATIONS USED:  NONE  SPECIMEN:  none  COUNTS: confirmed correct.  TOURNIQUET:    Total Tourniquet Time Documented: Upper Arm (Left) - 14 minutes Total: Upper Arm (Left) - 14 minutes   PATIENT DISPOSITION:  PACU - hemodynamically stable.   Delay start of Pharmacological VTE agent (>24hrs) due to surgical blood loss or risk of bleeding: no  INDICATION FOR PROCEDURE: [name] is a 22 y.o. male with complex laceration to the left forearm with arterial bleeding. The patient was taken emergently to the operating room for hemorrhage control and forearm laceration repair.  OPERATIVE FINDINGS: complex laceration to the forearm with exposed, protruding muscle belly. Hemorrhage returned after application of pneumatic tourniquet. Ulnar artery transected. Ulnar nerve injured, but not transected. Vascular control of the arteries with bulldog clamps allowed vascular exam of hand. Triphasic radial signal and intact palmar arch signal. Weak ulnar signal. Both ends of ulnar injury ligated.  DESCRIPTION OF PROCEDURE: After identification of the patient in the pre-operative holding area, the patient was transferred to the operating room. The patient was positioned supine on the operating room table. Anesthesia was induced. The left arm was prepped and draped in standard fashion. A surgical pause was performed confirming correct patient, procedure, and operative location.  A pneumatic tourniquet was applied to the upper arm on the left side.   This was inflated after bleeding was encountered during prepping.  The wound was explored.  A small volume of hematoma was evacuated.  We identified the arterial injury in the ulnar forearm.  The ulnar artery appeared completely transected.  The ulnar nerve appeared injured but not transected.  Both ends of the ulnar artery were skeletonized for several centimeters.  Bulldog clamps were applied to the ulnar artery proximally and distally.  The pneumatic tourniquet was released.  Hemostasis was good.  The hand was evaluated with Doppler machine.  Brisk, nearly triphasic Doppler flow was heard in the radial artery.  Brisk Doppler flow was heard in the palmar arch.  A weak signal was heard over the ulnar artery suggesting backbleeding from the palmar arch.  Satisfied I proceeded with ligation.  The cut ends of the ulnar artery were then ligated using interrupted 6-0 Prolene suture.  After suturing the wound was found hemostatic.  At this point the case was turned over to Dr. Janee Morn for soft tissue and nerve repair.  Rande Brunt. Lenell Antu, MD [facility] Phone Number: [phone] 03/18/2023 6:34 AM

## 2023-03-18 NOTE — TOC CAGE-AID Note (Signed)
Transition of Care [facility]) - CAGE-AID Screening   Patient Details  Name: George Chapman MRN: [id number] Date of Birth: Feb 02, 2001  Transition of Care [facility]) CM/SW Contact:    Katha Hamming, RN Phone Number: 03/18/2023, 6:16 AM    CAGE-AID Screening:    Have You Ever Felt You Ought to Cut Down on Your Drinking or Drug Use?: No Have People Annoyed You By Critizing Your Drinking Or Drug Use?: No Have You Felt Bad Or Guilty About Your Drinking Or Drug Use?: No Have You Ever Had a Drink or Used Drugs First Thing In The Morning to Steady Your Nerves or to Get Rid of a Hangover?: No CAGE-AID Score: 0  Substance Abuse Education Offered: No

## 2023-03-18 NOTE — Anesthesia Procedure Notes (Signed)
Procedure Name: Intubation Date/Time: 03/18/2023 5:57 AM  Performed by: Tressia Miners, CRNAPre-anesthesia Checklist: Patient identified, Emergency Drugs available, Suction available, Patient being monitored and Timeout performed Patient Re-evaluated:Patient Re-evaluated prior to induction Oxygen Delivery Method: Circle system utilized Preoxygenation: Pre-oxygenation with 100% oxygen Induction Type: IV induction, Rapid sequence and Cricoid Pressure applied Laryngoscope Size: Mac and 4 Grade View: Grade I Tube type: Oral Tube size: 7.5 mm Number of attempts: 1 Airway Equipment and Method: Stylet Placement Confirmation: ETT inserted through vocal cords under direct vision, positive ETCO2 and breath sounds checked- equal and bilateral Secured at: 23 cm Tube secured with: Tape Dental Injury: Teeth and Oropharynx as per pre-operative assessment  Comments: Smooth IV Induction. Eyes taped. RSI Performed. DL x 1 with grade 1 view. Atraumatically placed, teeth and lip remain intact as pre-op. Secured with tape. Bilateral breath sounds +/=, EtCO2 +, Adequate TV, VSS.

## 2023-03-18 NOTE — ED Triage Notes (Signed)
Patient BIB [facility] from work with crush injury to the left hand & forearm with a 4-5 inch laceration to forearm.  Bleeding controlled with tourniquet.   EMS applied tourniquet to the left upper arm at 0323.  18g R FA 150 mcg fent  Patient reports hand and forearm pulling in between 2 rollers. He was unable to pull arm from between rollers.  Patient A & O x 4.

## 2023-03-18 NOTE — Discharge Instructions (Signed)
Discharge Instructions   You have a dressing with a plaster splint incorporated in it. Move your fingers as much as possible, making a full fist and fully opening the fist--not strong gripping, but with full flexion and extension of the digits Elevate your hand to reduce pain & swelling of the digits.  Ice over the operative site may be helpful to reduce pain & swelling.  DO NOT USE HEAT. Use ibuprofen and tylenol every 6 hours, and fill in with oxycodone only for pain over-and-above what these other 2 meds control Leave the dressing in place until you return to our office.  You may shower, but keep the bandage clean & dry.  You may drive a car when you are off of prescription pain medications and can safely control your vehicle with both hands. Our office will call you to arrange follow-up   Please call [phone] during normal business hours or [phone] after hours for any problems. Including the following:  - excessive redness of the incisions - drainage for more than 4 days - fever of more than 101.5 F  *Please note that pain medications will not be refilled after hours or on weekends.

## 2023-03-18 NOTE — Progress Notes (Signed)
Orthopedic Tech Progress Note Patient Details:  George Chapman [birth year] [id number]  Patient ID: [name], male   DOB: 2001/08/02, 23 y.o.   MRN: [id number] Level II; not needed at this time. Darleen Crocker 03/18/2023, 4:35 AM

## 2023-03-18 NOTE — ED Notes (Signed)
Tourniquet up after CT

## 2023-03-18 NOTE — ED Notes (Signed)
Tourniquet down for CT angio

## 2023-03-18 NOTE — Anesthesia Postprocedure Evaluation (Signed)
Anesthesia Post Note  Patient: Doctor, hospital  Procedure(s) Performed: LEFT FOREARM WOUND EXPLORATION (Left: Arm Lower) Ulnar artery ligation (Left: Arm Lower)     Patient location during evaluation: PACU Anesthesia Type: General Level of consciousness: awake and alert Pain management: pain level controlled Vital Signs Assessment: post-procedure vital signs reviewed and stable Respiratory status: spontaneous breathing, nonlabored ventilation and respiratory function stable Cardiovascular status: blood pressure returned to baseline and stable Postop Assessment: no apparent nausea or vomiting Anesthetic complications: no  No notable events documented.  Last Vitals:  Vitals:   03/18/23 0845 03/18/23 0900  BP: 121/76 (!) 114/90  Pulse: 78 (!) 107  Resp: 14 17  Temp:  36.7 C  SpO2: 100% 98%    Last Pain:  Vitals:   03/18/23 0800  TempSrc:   PainSc: 0-No pain                 Yuki Brunsman,W. EDMOND

## 2023-03-18 NOTE — ED Notes (Signed)
Patient returned from CT

## 2023-03-18 NOTE — ED Notes (Signed)
Trauma Response Nurse Documentation   Clemons Salvucci is a 22 y.o. male arriving to [facility] ED via EMS  On No antithrombotic. Trauma was activated as a Level 2 by ED charge RN based on the following trauma criteria Crush injury to extremity.  Patient cleared for CT by Dr. Clayborne Dana EDP. Pt transported to CT with trauma response nurse present to monitor. RN remained with the patient throughout their absence from the department for clinical observation.   GCS 15.  History   History reviewed. No pertinent past medical history.   History reviewed. No pertinent surgical history.     Initial Focused Assessment (If applicable, or please see trauma documentation): Alert/oriented male presents via EMS from work with left upper extremity crush injury, tourniquet in place.  Airway patent/unobstructed, BS clear Bleeding controlled with tourniquet, bleeding profusely when taken down, put up after assessment GCS 15 PERRLA   CT's Completed:   CT angio left upper extremity   Interventions:  IV start and trauma lab draw Portable left hand, forearm and humerus XRAY CT angio  TDAP ANCEF 2G Dilaudid for pain control Family presence/updates  Plan for disposition:  OR   Consults completed:  Hand surgery paged at 0429, arrived at 53. 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 Arrives with tourniquet in place. CT angio completed. Escorted to OR   Bedside handoff with OR RN Roanna Banning CRNA.    Yasamin Karel O Karlene Southard  Trauma Response RN  Please call TRN at [phone] for further assistance.

## 2023-03-18 NOTE — Consult Note (Addendum)
ORTHOPAEDIC CONSULTATION HISTORY & PHYSICAL REQUESTING PHYSICIAN: Mesner, Barbara Cower, MD  Chief Complaint: Left forearm wound  HPI: George Chapman is a 22 y.o. male who was at work when his left arm became entangled in some machinery.  He presented to emergency department with a anteromedial left forearm soft tissue defect and reportedly fairly heavy bleeding.  He arrived with a tourniquet in place.  This was taken down and apparently with a pressure dressing applied, it became soaked through, so tourniquet was reapplied.  However, Dr. Clayborne Dana indicated that the hand pinked up and he was able to palpate a radial pulse.  The patient was also able at that point to "move his fingers".  Hand surgery was consulted for management, and vascular surgery has been consulted by the ED as well.  Patient is ushered urgently to the operating room.  He received Ancef in the ED.  History reviewed. No pertinent past medical history. History reviewed. No pertinent surgical history. Social History   Socioeconomic History   Marital status: Single    Spouse name: Not on file   Number of children: Not on file   Years of education: Not on file   Highest education level: Not on file  Occupational History   Not on file  Tobacco Use   Smoking status: Never   Smokeless tobacco: Never  Substance and Sexual Activity   Alcohol use: No   Drug use: No   Sexual activity: Not on file  Other Topics Concern   Not on file  Social History Narrative   Not on file   Social Determinants of Health   Financial Resource Strain: Not on file  Food Insecurity: Low Risk  (01/28/2023)   Received from [facility]   Food vital sign    Within the past 12 months, you worried that your food would run out before you got money to buy more: Never true    Within the past 12 months, the food you bought just didn't last and you didn't have money to get more. : Never true  Transportation Needs: Not on file (01/28/2023)  Physical Activity:  Not on file  Stress: Not on file  Social Connections: Not on file   History reviewed. No pertinent family history. Allergies  Allergen Reactions   Peanut-Containing Drug Products    Prior to Admission medications   Medication Sig Start Date End Date Taking? Authorizing Provider  cephALEXin (KEFLEX) 500 MG capsule Take 1 capsule (500 mg total) by mouth 4 (four) times daily. 08/15/17   Renne Crigler, PA-C  ibuprofen (ADVIL,MOTRIN) 800 MG tablet Take 1 tablet (800 mg total) by mouth every 8 (eight) hours as needed. 01/31/17   Trixie Dredge, PA-C  ISOtretinoin (ACCUTANE) 40 MG capsule Take 40 mg by mouth 2 (two) times daily.    [provider]   DG Humerus Left  Result Date: 03/18/2023 CLINICAL DATA:  22 year old male status post work related blunt trauma to the left upper extremity. EXAM: LEFT HUMERUS - 1 VIEW COMPARISON:  Left forearm AP view today. FINDINGS: Single-view of the left humerus at 0407 hours. External artifact projects about the arm and left chest wall. Bone mineralization is within normal limits. No left humerus fracture is identified. Grossly maintained alignment at the left shoulder and elbow. Visible left ribs appear intact and the left lung appears clear. IMPRESSION: No acute fracture or dislocation identified on single view of the left humerus. Electronically Signed   By: Odessa Fleming M.D.   On: 03/18/2023 04:48  DG Forearm Left  Result Date: 03/18/2023 CLINICAL DATA:  22 year old male status post work related blunt trauma to the left upper extremity. EXAM: LEFT FOREARM - 1 VIEW COMPARISON:  Single-view left hand today reported separately. FINDINGS: Single AP view of the forearm at 0407 hours. Radial aspect forearm soft tissue irregularity with dressing material in place. Underlying normal forearm bone mineralization. No fracture identified in the visible left radius or ulna. The elbow is incompletely included. IMPRESSION: Radial aspect left forearm soft tissue injury. No  fracture or dislocation identified on single view of left forearm. Electronically Signed   By: Odessa Fleming M.D.   On: 03/18/2023 04:47   DG Hand 2 View Left  Result Date: 03/18/2023 CLINICAL DATA:  22 year old male status post work related blunt trauma to the left upper extremity. EXAM: LEFT HAND - 1 VIEW COMPARISON:  None Available. FINDINGS: AP view only of the left hand at 0408 hours. Bone mineralization is within normal limits. Distal radius and ulna appear intact. No carpal, metacarpal, or phalanx fracture or dislocation is identified. IMPRESSION: No acute fracture or dislocation identified on single view of the left hand. Electronically Signed   By: Odessa Fleming M.D.   On: 03/18/2023 04:45    Positive ROS: All other systems have been reviewed and were otherwise negative with the exception of those mentioned in the HPI and as above.  Physical Exam: Vitals: Refer to EMR. Constitutional:  WD, WN, NAD HEENT:  NCAT, EOMI Neuro/Psych:  Alert & oriented to person, place, and time; appropriate mood & affect Lymphatic: No generalized extremity edema or lymphadenopathy Extremities / MSK:  The extremities are normal with respect to appearance, ranges of motion, joint stability, muscle strength/tone, sensation, & perfusion except as otherwise noted:  Left forearm with a pressure dressing on the wound and an upper arm tourniquet in place.  The arm distal to the tourniquet is purplish.  Sensibility to light touch is not present in the radial, median, or ulnar distribution of the hand.  No discernible movement of the digits.  Assessment: Left anteromedial forearm wound with reportedly heavy bleeding, presently with tourniquet in place, without fractures  Plan: I discussed with the patient and his mother a plan for urgent exploration of the wound in the operating room with repair of structures as indicated and consent was obtained.  Dr. Lenell Antu to provide vascular surgery expertise.  Cliffton Asters Janee Morn, MD       Orthopaedic & Hand Surgery [facility] Orthopaedic & Sports Medicine [facility] [address] [place], [place]  [address] Office: [phone]  03/18/2023, 5:31 AM

## 2023-03-18 NOTE — Transfer of Care (Signed)
Immediate Anesthesia Transfer of Care Note  Patient: George Chapman  Procedure(s) Performed: LEFT FOREARM WOUND EXPLORATION (Left: Arm Lower) Ulnar artery ligation (Left: Arm Lower)  Patient Location: PACU  Anesthesia Type:General  Level of Consciousness: drowsy, patient cooperative, and responds to stimulation  Airway & Oxygen Therapy: Patient Spontanous Breathing  Post-op Assessment: Report given to RN and Post -op Vital signs reviewed and stable  Post vital signs: Reviewed and stable  Last Vitals:  Vitals Value Taken Time  BP 119/69 03/18/23 0757  Temp    Pulse 76 03/18/23 0759  Resp 13 03/18/23 0759  SpO2 98 % 03/18/23 0759  Vitals shown include unfiled device data.  Last Pain:  Vitals:   03/18/23 0522  TempSrc:   PainSc: 10-Worst pain ever         Complications: No notable events documented.

## 2023-03-18 NOTE — ED Notes (Signed)
Patient transported to CT 

## 2023-03-18 NOTE — ED Notes (Signed)
Tourniquet applied to the left upper arm at 0323 by EMS

## 2023-03-18 NOTE — ED Notes (Signed)
Tourniquet to the left upper arm reapplied at 0429.

## 2023-03-19 ENCOUNTER — Encounter (HOSPITAL_COMMUNITY): Payer: Self-pay | Admitting: Orthopedic Surgery
